# Patient Record
Sex: Female | Born: 2000 | Race: White | Hispanic: No | Marital: Single | Smoking: Never smoker
Health system: Southern US, Community
[De-identification: ages and names within clinical notes are randomized; demographics above are authoritative.]

## PROBLEM LIST (undated history)

## (undated) DIAGNOSIS — D649 Anemia, unspecified: Secondary | ICD-10-CM

## (undated) DIAGNOSIS — I10 Essential (primary) hypertension: Secondary | ICD-10-CM

---

## 2022-04-17 ENCOUNTER — Emergency Department (HOSPITAL_BASED_OUTPATIENT_CLINIC_OR_DEPARTMENT_OTHER): Payer: Self-pay

## 2022-04-17 ENCOUNTER — Encounter (HOSPITAL_BASED_OUTPATIENT_CLINIC_OR_DEPARTMENT_OTHER): Payer: Self-pay | Admitting: Urology

## 2022-04-17 ENCOUNTER — Other Ambulatory Visit: Payer: Self-pay

## 2022-04-17 ENCOUNTER — Emergency Department (HOSPITAL_BASED_OUTPATIENT_CLINIC_OR_DEPARTMENT_OTHER)
Admission: EM | Admit: 2022-04-17 | Discharge: 2022-04-17 | Disposition: A | Discharge status: Home / Self Care | Payer: Worker's Compensation | Attending: Emergency Medicine | Admitting: Emergency Medicine

## 2022-04-17 DIAGNOSIS — S61051A Open bite of right thumb without damage to nail, initial encounter: Secondary | ICD-10-CM | POA: Insufficient documentation

## 2022-04-17 DIAGNOSIS — S71111A Laceration without foreign body, right thigh, initial encounter: Secondary | ICD-10-CM

## 2022-04-17 DIAGNOSIS — W540XXA Bitten by dog, initial encounter: Secondary | ICD-10-CM | POA: Insufficient documentation

## 2022-04-17 DIAGNOSIS — S60011A Contusion of right thumb without damage to nail, initial encounter: Secondary | ICD-10-CM

## 2022-04-17 MED ORDER — AMOXICILLIN-POT CLAVULANATE 875-125 MG PO TABS
1.0000 | ORAL_TABLET | Freq: Once | ORAL | Status: AC
  Administered 2022-04-17: 1 via ORAL
  Filled 2022-04-17: qty 1

## 2022-04-17 MED ORDER — BACITRACIN ZINC 500 UNIT/GM EX OINT
TOPICAL_OINTMENT | Freq: Once | CUTANEOUS | Status: AC
  Filled 2022-04-17: qty 28.35

## 2022-04-17 MED ORDER — AMOXICILLIN-POT CLAVULANATE 875-125 MG PO TABS: 1.0000 | ORAL_TABLET | Freq: Two times a day (BID) | ORAL | 0 refills | Status: DC

## 2022-04-17 NOTE — Discharge Instructions (Addendum)
Apply antibiotic ointment to the laceration twice a day.  Keep the laceration covered with a Band-Aid.  Apply ice for 30 minutes at a time, 4 times a day.  Take ibuprofen or naproxen as needed for pain.  If you need additional pain relief, add acetaminophen.  Return if any signs of infection develop.

## 2022-04-17 NOTE — ED Triage Notes (Signed)
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   Unknown if dog is vaccinated,  TDap unknown

## 2022-04-17 NOTE — ED Provider Notes (Signed)
[facility] EMERGENCY DEPARTMENT Provider Note   CSN: [id number] Arrival date & time: 04/17/22  0011     History  Chief Complaint  Patient presents with   Animal Bite    Wendy Graham is a 21 y.o. female.  The history is provided by the patient.  Animal Bite She has no significant past history, and comes in following a dog bite to her right thumb.  The dog's owner was unresponsive and patient was trying to get to the dog's owner when the dog bit her.  It is unknown if the dog is up-to-date on vaccinations.  Patient is up-to-date on tetanus immunizations.  She is complaining of pain in the region of the right first MCP joint.   Home Medications Prior to Admission medications   Not on File      Allergies    Patient has no known allergies.    Review of Systems   Review of Systems  All other systems reviewed and are negative.   Physical Exam Updated Vital Signs BP (!) 142/94 (BP Location: Left Arm)   Pulse (!) 118   Temp 98.6 F (37 C) (Oral)   Resp 18   Ht 5\' 4"  (1.626 m)   Wt 74.8 kg   LMP 04/10/2022 (Approximate)   SpO2 99%   BMI 28.32 kg/m  Physical Exam Vitals and nursing note reviewed.   21 year old female, resting comfortably and in no acute distress. Vital signs are significant for elevated heart rate and blood pressure. Oxygen saturation is 99%, which is normal. Head is normocephalic and atraumatic. PERRLA, EOMI. Oropharynx is clear. Neck is nontender and supple. Lungs are clear without rales, wheezes, or rhonchi. Chest is nontender. Heart has regular rate and rhythm without murmur. Abdomen is soft, flat, nontender. Extremities: Either superficial laceration of the right thumb at about the level of the MCP joint.  Laceration is on the radial side and is not gaping to the point where it would need any kind of closure. Skin is warm and dry without rash. Neurologic: Mental status is normal, cranial nerves are intact, moves all extremities  equally.  ED Results / Procedures / Treatments    Radiology DG Finger Thumb Right  Result Date: 04/17/2022 CLINICAL DATA:  Recent dog bite to the thumb with pain, initial encounter EXAM: RIGHT THUMB 2+V COMPARISON:  None Available. FINDINGS: There is no evidence of fracture or dislocation. There is no evidence of arthropathy or other focal bone abnormality. Soft tissues are unremarkable. IMPRESSION: No acute abnormality noted. Electronically Signed   By: 06/17/2022 M.D.   On: 04/17/2022 03:06    Procedures Procedures    Medications Ordered in ED Medications  bacitracin ointment (has no administration in time range)  amoxicillin-clavulanate (AUGMENTIN) 875-125 MG per tablet 1 tablet (has no administration in time range)    ED Course/ Medical Decision Making/ A&P                           Medical Decision Making Amount and/or Complexity of Data Reviewed Radiology: ordered.  Risk OTC drugs. Prescription drug management.   Superficial laceration of the right thumb from a dog bite.  This is a low risk bite as the dog was acting appropriately.  Patient advised that they do need to find out the dog's vaccination status.  If unvaccinated, the dog is able to be observed for the next week.  Therefore, rabies vaccination series is not initiated.  She is complaining of pain in her thumb, I have ordered x-rays to rule out fracture.  X-rays show no evidence of fracture.  I have independently viewed the images, and agree with radiologist interpretation.  She is discharged with prescription for amoxicillin-clavulanic acid as prophylaxis against infection.  Recommended ice and elevation, use over-the-counter analgesics as needed for pain.  Report is already been given to animal control who will contact her if there is any concern about rabies.  Return precautions discussed.  Final Clinical Impression(s) / ED Diagnoses Final diagnoses:  Dog bite, initial encounter  Laceration of right thigh,  initial encounter  Contusion of right thumb without damage to nail, initial encounter    Rx / DC Orders ED Discharge Orders          Ordered    amoxicillin-clavulanate (AUGMENTIN) 875-125 MG tablet  Every 12 hours        04/17/22 0331              Dione Booze, MD 04/17/22 [phone]

## 2023-04-03 ENCOUNTER — Encounter (HOSPITAL_COMMUNITY): Payer: Self-pay | Admitting: Obstetrics & Gynecology

## 2023-04-03 ENCOUNTER — Other Ambulatory Visit: Payer: Self-pay

## 2023-04-03 ENCOUNTER — Inpatient Hospital Stay (HOSPITAL_COMMUNITY)
Admission: AD | Admit: 2023-04-03 | Discharge: 2023-04-04 | DRG: 776 | Disposition: A | Discharge status: Home / Self Care | Payer: BC Managed Care – PPO | Attending: Obstetrics & Gynecology | Admitting: Obstetrics & Gynecology

## 2023-04-03 DIAGNOSIS — O99215 Obesity complicating the puerperium: Secondary | ICD-10-CM | POA: Diagnosis present

## 2023-04-03 DIAGNOSIS — R030 Elevated blood-pressure reading, without diagnosis of hypertension: Secondary | ICD-10-CM | POA: Diagnosis not present

## 2023-04-03 DIAGNOSIS — O1415 Severe pre-eclampsia, complicating the puerperium: Principal | ICD-10-CM | POA: Diagnosis present

## 2023-04-03 DIAGNOSIS — Z3A40 40 weeks gestation of pregnancy: Secondary | ICD-10-CM

## 2023-04-03 HISTORY — DX: Anemia, unspecified: D64.9

## 2023-04-03 HISTORY — DX: Essential (primary) hypertension: I10

## 2023-04-03 LAB — CBC
HCT: 34.2 % — ABNORMAL LOW (ref 36.0–46.0)
Hemoglobin: 11.1 g/dL — ABNORMAL LOW (ref 12.0–15.0)
MCH: 29.1 pg (ref 26.0–34.0)
MCHC: 32.5 g/dL (ref 30.0–36.0)
MCV: 89.5 fL (ref 80.0–100.0)
Platelets: 341 10*3/uL (ref 150–400)
RBC: 3.82 MIL/uL — ABNORMAL LOW (ref 3.87–5.11)
RDW: 13.2 % (ref 11.5–15.5)
WBC: 8.3 10*3/uL (ref 4.0–10.5)
nRBC: 0 % (ref 0.0–0.2)

## 2023-04-03 LAB — COMPREHENSIVE METABOLIC PANEL
ALT: 21 U/L (ref 0–44)
AST: 18 U/L (ref 15–41)
Albumin: 2.6 g/dL — ABNORMAL LOW (ref 3.5–5.0)
Alkaline Phosphatase: 96 U/L (ref 38–126)
Anion gap: 9 (ref 5–15)
BUN: 13 mg/dL (ref 6–20)
CO2: 21 mmol/L — ABNORMAL LOW (ref 22–32)
Calcium: 8.7 mg/dL — ABNORMAL LOW (ref 8.9–10.3)
Chloride: 108 mmol/L (ref 98–111)
Creatinine, Ser: 0.57 mg/dL (ref 0.44–1.00)
GFR, Estimated: >60 mL/min (ref >60)
Glucose, Bld: 109 mg/dL — ABNORMAL HIGH (ref 70–99)
Potassium: 4.1 mmol/L (ref 3.5–5.1)
Sodium: 138 mmol/L (ref 135–145)
Total Bilirubin: 0.5 mg/dL (ref 0.3–1.2)
Total Protein: 6.2 g/dL — ABNORMAL LOW (ref 6.5–8.1)

## 2023-04-03 LAB — TYPE AND SCREEN
ABO/RH(D): A POS
Antibody Screen: NEGATIVE

## 2023-04-03 MED ORDER — FUROSEMIDE 20 MG PO TABS
20.0000 mg | ORAL_TABLET | Freq: Two times a day (BID) | ORAL | Status: DC
  Administered 2023-04-03 – 2023-04-04 (×3): 20 mg via ORAL
  Filled 2023-04-03 (×3): qty 1

## 2023-04-03 MED ORDER — IBUPROFEN 600 MG PO TABS
600.0000 mg | ORAL_TABLET | Freq: Four times a day (QID) | ORAL | Status: DC | PRN
  Administered 2023-04-04: 600 mg via ORAL
  Filled 2023-04-03: qty 1

## 2023-04-03 MED ORDER — MAGNESIUM SULFATE 40 GM/1000ML IV SOLN
INTRAVENOUS | Status: AC
  Filled 2023-04-03: qty 1000

## 2023-04-03 MED ORDER — HYDROMORPHONE HCL 1 MG/ML IJ SOLN: 0.2000 mg | INTRAMUSCULAR | Status: DC | PRN

## 2023-04-03 MED ORDER — NIFEDIPINE ER OSMOTIC RELEASE 30 MG PO TB24
30.0000 mg | ORAL_TABLET | Freq: Every day | ORAL | Status: DC
  Administered 2023-04-03: 30 mg via ORAL
  Filled 2023-04-03: qty 1

## 2023-04-03 MED ORDER — LABETALOL HCL 5 MG/ML IV SOLN: 20.0000 mg | INTRAVENOUS | Status: DC | PRN

## 2023-04-03 MED ORDER — LABETALOL HCL 5 MG/ML IV SOLN: 40.0000 mg | INTRAVENOUS | Status: DC | PRN

## 2023-04-03 MED ORDER — LACTATED RINGERS IV SOLN
INTRAVENOUS | Status: DC
  Administered 2023-04-03: 1000 mL via INTRAVENOUS

## 2023-04-03 MED ORDER — LACTATED RINGERS IV SOLN: Freq: Once | INTRAVENOUS | Status: DC

## 2023-04-03 MED ORDER — DOCUSATE SODIUM 100 MG PO CAPS
100.0000 mg | ORAL_CAPSULE | Freq: Two times a day (BID) | ORAL | Status: DC
  Administered 2023-04-04: 100 mg via ORAL
  Filled 2023-04-03: qty 1

## 2023-04-03 MED ORDER — ONDANSETRON HCL 4 MG PO TABS: 4.0000 mg | ORAL_TABLET | Freq: Four times a day (QID) | ORAL | Status: DC | PRN

## 2023-04-03 MED ORDER — MAGNESIUM CITRATE PO SOLN: 1.0000 | Freq: Once | ORAL | Status: DC | PRN

## 2023-04-03 MED ORDER — MAGNESIUM HYDROXIDE 400 MG/5ML PO SUSP: 30.0000 mL | Freq: Every day | ORAL | Status: DC | PRN

## 2023-04-03 MED ORDER — ZOLPIDEM TARTRATE 5 MG PO TABS: 5.0000 mg | ORAL_TABLET | Freq: Every evening | ORAL | Status: DC | PRN

## 2023-04-03 MED ORDER — LABETALOL HCL 5 MG/ML IV SOLN: 80.0000 mg | INTRAVENOUS | Status: DC | PRN

## 2023-04-03 MED ORDER — OXYCODONE-ACETAMINOPHEN 5-325 MG PO TABS: 1.0000 | ORAL_TABLET | ORAL | Status: DC | PRN

## 2023-04-03 MED ORDER — HYDRALAZINE HCL 20 MG/ML IJ SOLN: 10.0000 mg | INTRAMUSCULAR | Status: DC | PRN

## 2023-04-03 MED ORDER — MAGNESIUM SULFATE BOLUS VIA INFUSION
4.0000 g | Freq: Once | INTRAVENOUS | Status: AC
  Administered 2023-04-03: 4 g via INTRAVENOUS
  Filled 2023-04-03: qty 1000

## 2023-04-03 MED ORDER — LABETALOL HCL 5 MG/ML IV SOLN
INTRAVENOUS | Status: AC
  Administered 2023-04-03: 20 mg via INTRAVENOUS
  Filled 2023-04-03: qty 4

## 2023-04-03 MED ORDER — ALUM & MAG HYDROXIDE-SIMETH 200-200-20 MG/5ML PO SUSP: 30.0000 mL | ORAL | Status: DC | PRN

## 2023-04-03 MED ORDER — ENOXAPARIN SODIUM 40 MG/0.4ML IJ SOSY
40.0000 mg | PREFILLED_SYRINGE | INTRAMUSCULAR | Status: DC
  Administered 2023-04-03: 40 mg via SUBCUTANEOUS
  Filled 2023-04-03: qty 0.4

## 2023-04-03 MED ORDER — BISACODYL 5 MG PO TBEC: 5.0000 mg | DELAYED_RELEASE_TABLET | Freq: Every day | ORAL | Status: DC | PRN

## 2023-04-03 MED ORDER — ONDANSETRON HCL 4 MG/2ML IJ SOLN: 4.0000 mg | Freq: Four times a day (QID) | INTRAMUSCULAR | Status: DC | PRN

## 2023-04-03 MED ORDER — MAGNESIUM SULFATE 40 GM/1000ML IV SOLN
2.0000 g/h | INTRAVENOUS | Status: DC
  Administered 2023-04-03: 2 g/h via INTRAVENOUS
  Filled 2023-04-03: qty 1000

## 2023-04-03 NOTE — MAU Note (Signed)
In b-room  

## 2023-04-03 NOTE — H&P (Signed)
ADMISSION HISTORY AND PHYSICAL NOTE  Wendy Graham is a 22 y.o. female G1P1001 s/p primary cesarean for breech on 03/30/2023 with [facility]. She is presenting for new onset severe range blood pressures in MAU. She endorses bilateral lower extremity edema. She denies headache, visual disturbances, and RUQ/epigastric pain.   She is exclusively breastfeeding.  Prenatal History/Complications: -PNC at [facility] -Primary cesarean -Maternal obesity -Late prenatal care (first visit documented at 25 weeks)  Past Medical History: Past Medical History:  Diagnosis Date   Anemia    Hypertension    new onset    Past Surgical History: Past Surgical History:  Procedure Laterality Date   CESAREAN SECTION N/A    primary CS on 02/28/2023    Obstetrical History: OB History     Gravida  1   Para  1   Term  1   Preterm  0   AB  0   Living  1      SAB  0   IAB  0   Ectopic  0   Multiple  0   Live Births  1           Social History: Social History   Socioeconomic History   Marital status: Single    Spouse name: Not on file   Number of children: Not on file   Years of education: Not on file   Highest education level: Not on file  Occupational History   Not on file  Tobacco Use   Smoking status: Never   Smokeless tobacco: Never  Vaping Use   Vaping Use: Never used  Substance and Sexual Activity   Alcohol use: Never   Drug use: Never   Sexual activity: Not Currently  Other Topics Concern   Not on file  Social History Narrative   Not on file   Social Determinants of Health   Financial Resource Strain: Not on file  Food Insecurity: Not on file  Transportation Needs: Not on file  Physical Activity: Not on file  Stress: Not on file  Social Connections: Not on file    Family History: History reviewed. No pertinent family history.  Allergies: No Known Allergies  Medications Prior to Admission  Medication Sig Dispense Refill Last Dose   enoxaparin  (LOVENOX) 40 MG/0.4ML injection Inject 40 mg into the skin daily.   04/02/2023 at 2300   Ferrous Fumarate (HEMOCYTE - 106 MG FE) 324 (106 Fe) MG TABS tablet Take 1 tablet by mouth daily.   04/03/2023   ibuprofen (ADVIL) 400 MG tablet Take 400 mg by mouth every 6 (six) hours as needed for moderate pain.   04/03/2023 at 1500   oxycodone (OXY-IR) 5 MG capsule Take 5 mg by mouth every 4 (four) hours as needed for pain.   04/03/2023 at 0300   amoxicillin-clavulanate (AUGMENTIN) 875-125 MG tablet Take 1 tablet by mouth every 12 (twelve) hours. 10 tablet 0      Review of Systems  All systems reviewed and negative except as stated in HPI  Physical Exam Blood pressure (!) 160/89, pulse (!) 114, temperature 97.6 F (36.4 C), temperature source Oral, resp. rate 16, height 5\' 4"  (1.626 m), weight 116 kg. General appearance: alert, oriented Lungs: normal respiratory effort Heart: regular rate Extremities: +2 non-pitting edema   Patient Vitals for the past 24 hrs:  BP Temp Temp src Pulse Resp Height Weight  04/03/23 2145 (!) 160/89 -- -- (!) 114 -- -- --  04/03/23 2131 (!) 163/95 -- -- (!) 119 -- -- --  04/03/23 2114 (!) 161/82 -- -- (!) 101 -- -- --  04/03/23 2106 (!) 152/92 -- -- (!) 118 -- -- --  04/03/23 2028 (!) 153/91 97.6 F (36.4 C) Oral (!) 102 16 5\' 4"  (1.626 m) 116 kg    Results for orders placed or performed during the hospital encounter of 04/03/23 (from the past 24 hour(s))  CBC   Collection Time: 04/03/23  9:06 PM  Result Value Ref Range   WBC 8.3 4.0 - 10.5 K/uL   RBC 3.82 (L) 3.87 - 5.11 MIL/uL   Hemoglobin 11.1 (L) 12.0 - 15.0 g/dL   HCT 16.1 (L) 09.6 - 04.5 %   MCV 89.5 80.0 - 100.0 fL   MCH 29.1 26.0 - 34.0 pg   MCHC 32.5 30.0 - 36.0 g/dL   RDW 40.9 81.1 - 91.4 %   Platelets 341 150 - 400 K/uL   nRBC 0.0 0.0 - 0.2 %    Assessment: -Wendy Graham is a 22 y.o. G1P1001 admitted for Postpartum Preeclampsia with SF -PEC labs in process -Per Dr. Macon Large, admit to [facility] for  Magnesium Sulfate infusion   Calvert Cantor, MSA, MSN, CNM 04/03/2023, 10:06 PM

## 2023-04-03 NOTE — MAU Note (Signed)
Pt says she del  by C/S on 03-30-2023 Monday by Dr at [facility] . Urgent Care sent her here . Went to Urgent Care - at 7pm- for light headed and feet swollen .  They collected BP- NL, and urine . Now - no light headed , feet swollen  Breast feeding

## 2023-04-04 DIAGNOSIS — I10 Essential (primary) hypertension: Secondary | ICD-10-CM

## 2023-04-04 LAB — COMPREHENSIVE METABOLIC PANEL
ALT: 20 U/L (ref 0–44)
AST: 18 U/L (ref 15–41)
Albumin: 2.6 g/dL — ABNORMAL LOW (ref 3.5–5.0)
Alkaline Phosphatase: 92 U/L (ref 38–126)
Anion gap: 10 (ref 5–15)
BUN: 10 mg/dL (ref 6–20)
CO2: 22 mmol/L (ref 22–32)
Calcium: 8.3 mg/dL — ABNORMAL LOW (ref 8.9–10.3)
Chloride: 105 mmol/L (ref 98–111)
Creatinine, Ser: 0.54 mg/dL (ref 0.44–1.00)
GFR, Estimated: >60 mL/min (ref >60)
Glucose, Bld: 98 mg/dL (ref 70–99)
Potassium: 3.9 mmol/L (ref 3.5–5.1)
Sodium: 137 mmol/L (ref 135–145)
Total Bilirubin: 0.2 mg/dL — ABNORMAL LOW (ref 0.3–1.2)
Total Protein: 6.2 g/dL — ABNORMAL LOW (ref 6.5–8.1)

## 2023-04-04 LAB — CBC
HCT: 33.9 % — ABNORMAL LOW (ref 36.0–46.0)
Hemoglobin: 11.2 g/dL — ABNORMAL LOW (ref 12.0–15.0)
MCH: 28.7 pg (ref 26.0–34.0)
MCHC: 33 g/dL (ref 30.0–36.0)
MCV: 86.9 fL (ref 80.0–100.0)
Platelets: 365 10*3/uL (ref 150–400)
RBC: 3.9 MIL/uL (ref 3.87–5.11)
RDW: 13.3 % (ref 11.5–15.5)
WBC: 9.1 10*3/uL (ref 4.0–10.5)
nRBC: 0 % (ref 0.0–0.2)

## 2023-04-04 MED ORDER — NIFEDIPINE ER 90 MG PO TB24: 90.0000 mg | ORAL_TABLET | Freq: Every day | ORAL | 0 refills | Status: AC

## 2023-04-04 MED ORDER — NIFEDIPINE ER OSMOTIC RELEASE 60 MG PO TB24
60.0000 mg | ORAL_TABLET | Freq: Every day | ORAL | Status: DC
  Administered 2023-04-04: 60 mg via ORAL
  Filled 2023-04-04: qty 1

## 2023-04-04 NOTE — Progress Notes (Signed)
    Faculty Practice OB/GYN Attending Note  Subjective:  Patient doing well this morning.  Denies any headaches, visual symptoms, RUQ/epigastric pain or other concerning symptoms. Minimal incisional pain.    Admitted on 04/03/2023 for Hypertension in pregnancy, preeclampsia, severe, postpartum condition.    Objective:  Blood pressure (!) 148/84, pulse (!) 103, temperature 97.6 F (36.4 C), temperature source Oral, resp. rate 17, height 5\' 4"  (1.626 m), weight 116 kg. Patient Vitals for the past 24 hrs:  BP Temp Temp src Pulse Resp Height Weight  04/04/23 0700 (!) 148/84 -- -- (!) 103 17 -- --  04/04/23 0610 (!) 147/79 -- -- (!) 105 17 -- --  04/04/23 0500 (!) 143/76 -- -- (!) 111 17 -- --  04/04/23 0400 (!) 149/79 -- -- (!) 107 17 -- --  04/04/23 0300 (!) 151/78 -- -- 98 17 -- --  04/04/23 0200 (!) 140/64 -- -- (!) 103 17 -- --  04/04/23 0100 (!) 153/89 -- -- 99 18 -- --  04/04/23 0000 (!) 150/87 -- -- (!) 101 17 -- --  04/03/23 2334 (!) 148/89 -- -- 99 18 -- --  04/03/23 2253 (!) 143/78 97.6 F (36.4 C) Oral 89 18 -- --  04/03/23 2241 (!) 150/79 -- -- 99 -- -- --  04/03/23 2236 137/87 -- -- (!) 104 -- -- --  04/03/23 2231 (!) 140/71 -- -- (!) 101 -- -- --  04/03/23 2221 (!) 155/87 -- -- (!) 102 -- -- --  04/03/23 2214 (!) 143/74 -- -- 89 -- -- --  04/03/23 2201 (!) 153/84 -- -- (!) 110 -- -- --  04/03/23 2145 (!) 160/89 -- -- (!) 114 -- -- --  04/03/23 2131 (!) 163/95 -- -- (!) 119 -- -- --  04/03/23 2114 (!) 161/82 -- -- (!) 101 -- -- --  04/03/23 2106 (!) 152/92 -- -- (!) 118 -- -- --  04/03/23 2028 (!) 153/91 97.6 F (36.4 C) Oral (!) 102 16 5\' 4"  (1.626 m) 116 kg   Gen: NAD HENT: Normocephalic, atraumatic Lungs: Normal respiratory effort Heart: Regular rate noted Abdomen: NT, soft, incision C/D/I Cervix: Deferred Ext: 2+ DTRs, no edema, no cyanosis, negative Homan's sign  Assessment & Plan:  22 y.o. G1P1001 admitted for postpartum severe preeclampsia (BP), s/p  cesarean delivery on 03/30/23 at [facility].   Continue magnesium sulfate for now. Increased Procardia Xl to 60 mg daily for better control. Continue Lasix 20 mg po bid. Continue pain medications as needed, Lovenox for VTE prophylaxis. Continue close observation.   Jaynie Collins, MD, FACOG Obstetrician & Gynecologist, [facility] for [employer], [facility]

## 2023-04-04 NOTE — Discharge Summary (Signed)
Physician Discharge Summary  Patient ID: Wendy Graham MRN: [id number] DOB/AGE: [birth year] 22 y.o.  Admit date: 04/03/2023 Discharge date: 04/04/2023  Admission Diagnoses:postpartum preeclampsia  Discharge Diagnoses:  Principal Problem:   Hypertension in pregnancy, preeclampsia, severe, postpartum condition   Discharged Condition: good  Hospital Course: ADMISSION HISTORY AND PHYSICAL NOTE   Wendy Graham is a 22 y.o. female G1P1001 s/p primary cesarean for breech on 03/30/2023 with [facility]. She is presenting for new onset severe range blood pressures in MAU. She endorses bilateral lower extremity edema. She denies headache, visual disturbances, and RUQ/epigastric pain.    She is exclusively breastfeeding. She was admitted for Postpartum Preeclampsia with SF Labs were normal. Her BP was controlled on Procardia prior to discharge and she requested discharge   Prenatal History/Complications: -PNC at [facility] -Primary cesarean -Maternal obesity -Late prenatal care (first visit documented at 25 weeks)    Consults: None  Significant Diagnostic Studies: labs: CBC and CMP  Treatments: cardiac meds: Procardia. Magnesium sulfate  Discharge Exam: Blood pressure (!) 151/72, pulse (!) 123, temperature 97.8 F (36.6 C), temperature source Oral, resp. rate 19, height 5\' 4"  (1.626 m), weight 116 kg, SpO2 99 %. General appearance: alert, cooperative, and no distress Resp: normal effort GI: soft, non-tender; bowel sounds normal; no masses,  no organomegaly  Disposition: Discharge disposition: 01-Home or Self Care       Discharge Instructions     Discharge patient   Complete by: As directed    Discharge disposition: 01-Home or Self Care   Discharge patient date: 04/04/2023      Allergies as of 04/04/2023   No Known Allergies      Medication List     STOP taking these medications    amoxicillin-clavulanate 875-125 MG tablet Commonly known as: AUGMENTIN       TAKE these  medications    enoxaparin 40 MG/0.4ML injection Commonly known as: LOVENOX Inject 40 mg into the skin daily.   Ferrous Fumarate 324 (106 Fe) MG Tabs tablet Commonly known as: HEMOCYTE - 106 mg FE Take 1 tablet by mouth daily.   ibuprofen 400 MG tablet Commonly known as: ADVIL Take 400 mg by mouth every 6 (six) hours as needed for moderate pain.   NIFEdipine 90 MG 24 hr tablet Commonly known as: ADALAT CC Take 1 tablet (90 mg total) by mouth daily. Start taking on: Apr 05, 2023   oxycodone 5 MG capsule Commonly known as: OXY-IR Take 5 mg by mouth every 4 (four) hours as needed for pain.        Follow-up Information     Magda Kiel, MD Follow up.   Specialty: Obstetrics and Gynecology Contact information: [address] [place] [address] [phone]                 Signed: Scheryl Darter 04/04/2023, 4:19 PM

## 2023-04-04 NOTE — Plan of Care (Signed)
  Problem: Education: Goal: Knowledge of disease or condition will improve Outcome: Completed/Met Goal: Knowledge of the prescribed therapeutic regimen will improve Outcome: Completed/Met   Problem: Fluid Volume: Goal: Peripheral tissue perfusion will improve Outcome: Completed/Met   Problem: Clinical Measurements: Goal: Complications related to disease process, condition or treatment will be avoided or minimized Outcome: Completed/Met   Problem: Education: Goal: Knowledge of General Education information will improve Description: Including pain rating scale, medication(s)/side effects and non-pharmacologic comfort measures Outcome: Completed/Met   Problem: Health Behavior/Discharge Planning: Goal: Ability to manage health-related needs will improve Outcome: Completed/Met   Problem: Clinical Measurements: Goal: Ability to maintain clinical measurements within normal limits will improve Outcome: Completed/Met Goal: Will remain free from infection Outcome: Completed/Met Goal: Diagnostic test results will improve Outcome: Completed/Met Goal: Respiratory complications will improve Outcome: Completed/Met Goal: Cardiovascular complication will be avoided Outcome: Completed/Met   Problem: Activity: Goal: Risk for activity intolerance will decrease Outcome: Completed/Met   Problem: Nutrition: Goal: Adequate nutrition will be maintained Outcome: Completed/Met   Problem: Coping: Goal: Level of anxiety will decrease Outcome: Completed/Met   Problem: Elimination: Goal: Will not experience complications related to bowel motility Outcome: Completed/Met Goal: Will not experience complications related to urinary retention Outcome: Completed/Met   Problem: Pain Managment: Goal: General experience of comfort will improve Outcome: Completed/Met   Problem: Safety: Goal: Ability to remain free from injury will improve Outcome: Completed/Met   Problem: Skin Integrity: Goal:  Risk for impaired skin integrity will decrease Outcome: Completed/Met

## 2023-05-01 ENCOUNTER — Other Ambulatory Visit: Payer: Self-pay | Admitting: Obstetrics & Gynecology

## 2024-02-13 IMAGING — DX DG FINGER THUMB 2+V*R*
3 series · 3 of 3 positions shown · non-contrast
Comparison: None Available.

CLINICAL DATA: Recent dog bite to the thumb with pain, initial
encounter

EXAM:
RIGHT THUMB 2+V

[finger ap]
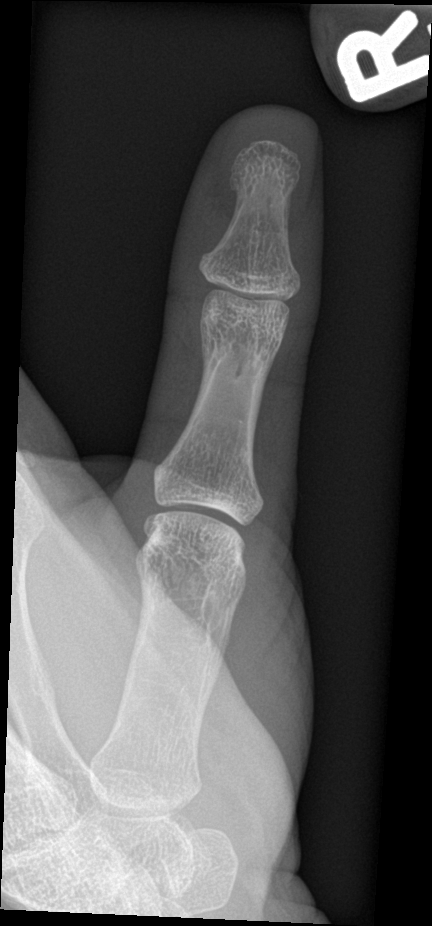

[finger obl]
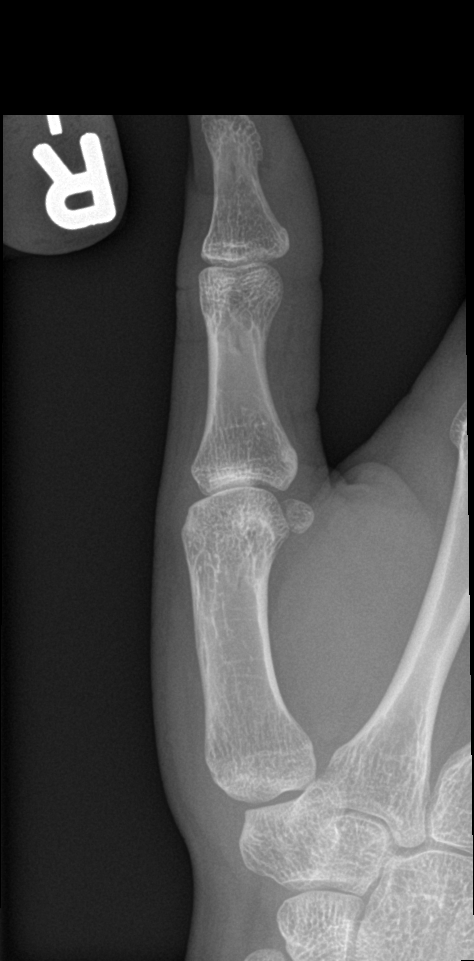

[finger lat]
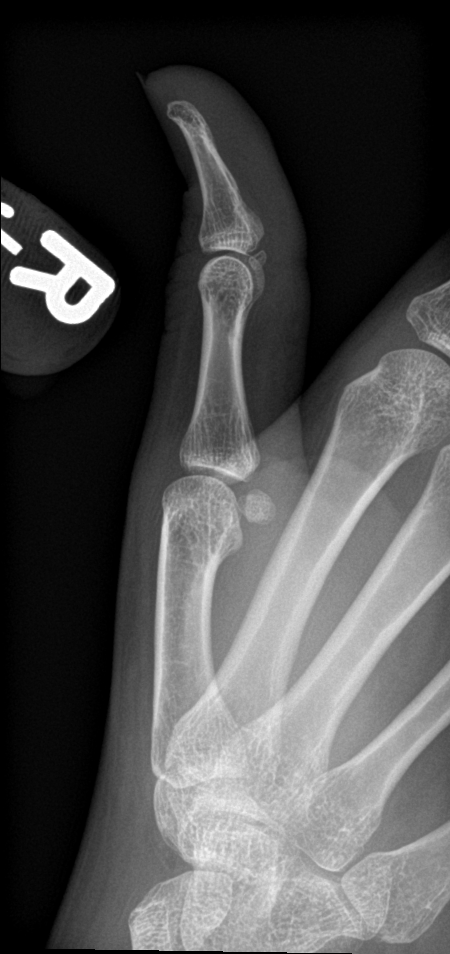

[3 of 3 positions shown; findings below may reference images not displayed]

FINDINGS: There is no evidence of fracture or dislocation. There is no
evidence of arthropathy or other focal bone abnormality. Soft
tissues are unremarkable.
IMPRESSION: No acute abnormality noted.
